# Patient Record
Sex: Female | Born: 1950 | Hispanic: No | Marital: Married
Health system: Southern US, Community
[De-identification: ages and names within clinical notes are randomized; demographics above are authoritative.]

## PROBLEM LIST (undated history)

## (undated) DIAGNOSIS — R519 Headache, unspecified: Secondary | ICD-10-CM

## (undated) DIAGNOSIS — I10 Essential (primary) hypertension: Secondary | ICD-10-CM

## (undated) DIAGNOSIS — R51 Headache: Secondary | ICD-10-CM

---

## 1998-11-23 ENCOUNTER — Encounter: Payer: Self-pay | Admitting: Neurosurgery

## 1998-11-23 ENCOUNTER — Emergency Department (HOSPITAL_COMMUNITY): Admission: EM | Admit: 1998-11-23 | Discharge: 1998-11-23 | Payer: Self-pay | Admitting: *Deleted

## 2001-06-04 ENCOUNTER — Inpatient Hospital Stay (HOSPITAL_COMMUNITY): Admission: AC | Admit: 2001-06-04 | Discharge: 2001-06-18 | Payer: Self-pay

## 2001-06-04 ENCOUNTER — Encounter: Payer: Self-pay | Admitting: Emergency Medicine

## 2001-06-07 ENCOUNTER — Encounter: Payer: Self-pay | Admitting: General Surgery

## 2001-06-20 ENCOUNTER — Ambulatory Visit (HOSPITAL_BASED_OUTPATIENT_CLINIC_OR_DEPARTMENT_OTHER): Admission: RE | Admit: 2001-06-20 | Discharge: 2001-06-20 | Payer: Self-pay | Admitting: *Deleted

## 2005-06-22 ENCOUNTER — Ambulatory Visit: Payer: Self-pay | Admitting: Unknown Physician Specialty

## 2005-12-13 ENCOUNTER — Emergency Department: Payer: Self-pay | Admitting: Emergency Medicine

## 2006-01-01 ENCOUNTER — Ambulatory Visit: Payer: Self-pay | Admitting: Family Medicine

## 2006-10-08 ENCOUNTER — Ambulatory Visit: Payer: Self-pay | Admitting: Family Medicine

## 2007-07-12 ENCOUNTER — Ambulatory Visit: Payer: Self-pay | Admitting: Family Medicine

## 2008-12-05 ENCOUNTER — Ambulatory Visit: Payer: Self-pay | Admitting: Family Medicine

## 2011-02-05 ENCOUNTER — Ambulatory Visit: Payer: Self-pay | Admitting: Family Medicine

## 2012-06-24 ENCOUNTER — Ambulatory Visit: Payer: Self-pay | Admitting: Family Medicine

## 2013-06-07 ENCOUNTER — Ambulatory Visit: Payer: Self-pay | Admitting: Family Medicine

## 2016-04-30 ENCOUNTER — Other Ambulatory Visit: Payer: Self-pay | Admitting: Family Medicine

## 2016-04-30 DIAGNOSIS — Z0000 Encounter for general adult medical examination without abnormal findings: Secondary | ICD-10-CM

## 2016-05-21 ENCOUNTER — Other Ambulatory Visit: Payer: Self-pay

## 2016-05-21 ENCOUNTER — Ambulatory Visit: Payer: Self-pay | Attending: Family Medicine

## 2016-07-24 ENCOUNTER — Encounter: Payer: Self-pay | Admitting: *Deleted

## 2016-07-27 ENCOUNTER — Ambulatory Visit: Payer: Medicaid Other | Admitting: Anesthesiology

## 2016-07-27 ENCOUNTER — Encounter
Admission: RE | Disposition: A | Discharge status: Home / Self Care | Payer: Self-pay | Source: Ambulatory Visit | Attending: Unknown Physician Specialty

## 2016-07-27 ENCOUNTER — Ambulatory Visit
Admission: RE | Admit: 2016-07-27 | Discharge: 2016-07-27 | Disposition: A | Discharge status: Home / Self Care | Payer: Medicaid Other | Source: Ambulatory Visit | Attending: Unknown Physician Specialty | Admitting: Unknown Physician Specialty

## 2016-07-27 DIAGNOSIS — D122 Benign neoplasm of ascending colon: Secondary | ICD-10-CM | POA: Insufficient documentation

## 2016-07-27 DIAGNOSIS — Z1211 Encounter for screening for malignant neoplasm of colon: Secondary | ICD-10-CM | POA: Insufficient documentation

## 2016-07-27 DIAGNOSIS — K648 Other hemorrhoids: Secondary | ICD-10-CM | POA: Insufficient documentation

## 2016-07-27 DIAGNOSIS — I10 Essential (primary) hypertension: Secondary | ICD-10-CM | POA: Insufficient documentation

## 2016-07-27 DIAGNOSIS — D120 Benign neoplasm of cecum: Secondary | ICD-10-CM | POA: Insufficient documentation

## 2016-07-27 HISTORY — DX: Essential (primary) hypertension: I10

## 2016-07-27 HISTORY — DX: Headache, unspecified: R51.9

## 2016-07-27 HISTORY — DX: Headache: R51

## 2016-07-27 HISTORY — PX: COLONOSCOPY WITH PROPOFOL: SHX5780

## 2016-07-27 SURGERY — COLONOSCOPY WITH PROPOFOL
Anesthesia: General

## 2016-07-27 MED ORDER — PROPOFOL 10 MG/ML IV BOLUS
INTRAVENOUS | Status: DC | PRN
  Administered 2016-07-27: 100 mg via INTRAVENOUS

## 2016-07-27 MED ORDER — SODIUM CHLORIDE 0.9 % IV SOLN
INTRAVENOUS | Status: DC
Start: 2016-07-27 — End: 2016-07-27
  Administered 2016-07-27: 1000 mL via INTRAVENOUS

## 2016-07-27 MED ORDER — PROPOFOL 500 MG/50ML IV EMUL
INTRAVENOUS | Status: DC | PRN
  Administered 2016-07-27: 120 ug/kg/min via INTRAVENOUS

## 2016-07-27 MED ORDER — MIDAZOLAM HCL 2 MG/2ML IJ SOLN
INTRAMUSCULAR | Status: DC | PRN
Start: 2016-07-27 — End: 2016-07-27
  Administered 2016-07-27: 1 mg via INTRAVENOUS

## 2016-07-27 NOTE — H&P (Signed)
   Primary Care Physician:  PROVIDER NOT [place] Primary Gastroenterologist:  Dr. Vira Agar  Pre-Procedure History & Physical: HPI:  Cynthia Fernandez is a 65 y.o. female is here for an colonoscopy.   Past Medical History:  Diagnosis Date  . Headache   . Hypertension     No past surgical history on file.  Prior to Admission medications   Medication Sig Start Date End Date Taking? Authorizing Provider  atorvastatin (LIPITOR) 40 MG tablet Take 40 mg by mouth daily.   Yes Historical Provider, MD  b complex vitamins capsule Take 1 capsule by mouth daily.   Yes Historical Provider, MD  gabapentin (NEURONTIN) 300 MG capsule Take 300 mg by mouth 3 (three) times daily.   Yes Historical Provider, MD  hydrochlorothiazide (HYDRODIURIL) 25 MG tablet Take 25 mg by mouth daily.   Yes Historical Provider, MD  meloxicam (MOBIC) 7.5 MG tablet Take 7.5 mg by mouth daily.   Yes Historical Provider, MD    Allergies as of 07/13/2016  . (Not on File)    No family history on file.  Social History   Social History  . Marital status: Married    Spouse name: N/A  . Number of children: N/A  . Years of education: N/A   Occupational History  . Not on file.   Social History Main Topics  . Smoking status: Not on file  . Smokeless tobacco: Not on file  . Alcohol use Not on file  . Drug use: Unknown  . Sexual activity: Not on file   Other Topics Concern  . Not on file   Social History Narrative  . No narrative on file    Review of Systems: See HPI, otherwise negative ROS  Physical Exam: BP (!) 155/67   Pulse 61   Temp 97.3 F (36.3 C) (Tympanic)   Resp 20   Ht 5' (1.524 m)   Wt 93 kg (205 lb)   SpO2 97%   BMI 40.04 kg/m  General:   Alert,  pleasant and cooperative in NAD Head:  Normocephalic and atraumatic. Neck:  Supple; no masses or thyromegaly. Lungs:  Clear throughout to auscultation.    Heart:  Regular rate and rhythm. Abdomen:  Soft, nontender and nondistended. Normal  bowel sounds, without guarding, and without rebound.   Neurologic:  Alert and  oriented x4;  grossly normal neurologically.  Impression/Plan: Cynthia Fernandez is here for an colonoscopy to be performed for screening  Risks, benefits, limitations, and alternatives regarding  colonoscopy have been reviewed with the patient.  Questions have been answered.  All parties agreeable.   Gaylyn Cheers, MD  07/27/2016, 9:02 AM

## 2016-07-27 NOTE — Anesthesia Preprocedure Evaluation (Addendum)
Anesthesia Evaluation  Patient identified by MRN, date of birth, ID band Patient awake    Reviewed: Allergy & Precautions, NPO status , Patient's Chart, lab work & pertinent test results  History of Anesthesia Complications Negative for: history of anesthetic complications  Airway Mallampati: III  TM Distance: >3 FB Neck ROM: Full    Dental  (+) Poor Dentition   Pulmonary neg pulmonary ROS, neg sleep apnea, neg COPD,    breath sounds clear to auscultation- rhonchi (-) wheezing      Cardiovascular Exercise Tolerance: Good hypertension, Pt. on medications (-) CAD, (-) Past MI and (-) Cardiac Stents  Rhythm:Regular Rate:Normal - Systolic murmurs and - Diastolic murmurs    Neuro/Psych  Headaches, negative psych ROS   GI/Hepatic negative GI ROS, Neg liver ROS,   Endo/Other  negative endocrine ROSneg diabetes  Renal/GU negative Renal ROS     Musculoskeletal negative musculoskeletal ROS (+)   Abdominal (+) + obese,   Peds  Hematology negative hematology ROS (+)   Anesthesia Other Findings Past Medical History: No date: Headache No date: Hypertension   Reproductive/Obstetrics                             Anesthesia Physical Anesthesia Plan  ASA: II  Anesthesia Plan: General   Post-op Pain Management:    Induction: Intravenous  Airway Management Planned: Natural Airway  Additional Equipment:   Intra-op Plan:   Post-operative Plan:   Informed Consent: I have reviewed the patients History and Physical, chart, labs and discussed the procedure including the risks, benefits and alternatives for the proposed anesthesia with the patient or authorized representative who has indicated his/her understanding and acceptance.   Dental advisory given  Plan Discussed with: CRNA and Anesthesiologist  Anesthesia Plan Comments:         Anesthesia Quick Evaluation

## 2016-07-27 NOTE — Anesthesia Postprocedure Evaluation (Signed)
Anesthesia Post Note  Patient: Cynthia Fernandez  Procedure(s) Performed: Procedure(s) (LRB): COLONOSCOPY WITH PROPOFOL (N/A)  Patient location during evaluation: Endoscopy Anesthesia Type: General Level of consciousness: awake and alert and oriented Pain management: pain level controlled Vital Signs Assessment: post-procedure vital signs reviewed and stable Respiratory status: spontaneous breathing, nonlabored ventilation and respiratory function stable Cardiovascular status: blood pressure returned to baseline and stable Postop Assessment: no signs of nausea or vomiting Anesthetic complications: no    Last Vitals:  Vitals:   07/27/16 0928 07/27/16 0930  BP: (!) 108/58 117/61  Pulse: 65   Resp: 16   Temp: (!) 35.8 C     Last Pain:  Vitals:   07/27/16 0928  TempSrc: Tympanic                 Oletta Buehring

## 2016-07-27 NOTE — Op Note (Signed)
[facility] Patient Name: Cynthia Fernandez Procedure Date: 07/27/2016 8:40 AM MRN: [id number] Account #: [id number] Date of Birth: [birth year] Admit Type: Outpatient Age: 65 Room: [facility] ENDO ROOM 1 Gender: Female Note Status: Finalized Procedure:            Colonoscopy Indications:          Screening for colorectal malignant neoplasm Providers:            Manya Silvas, MD Referring MD:         Denton Lank MD, MD (Referring MD) Medicines:            Propofol per Anesthesia Complications:        No immediate complications. Procedure:            Pre-Anesthesia Assessment:                       - After reviewing the risks and benefits, the patient                        was deemed in satisfactory condition to undergo the                        procedure.                       After obtaining informed consent, the colonoscope was                        passed under direct vision. Throughout the procedure,                        the patient's blood pressure, pulse, and oxygen                        saturations were monitored continuously. The                        Colonoscope was introduced through the anus and                        advanced to the the cecum, identified by appendiceal                        orifice and ileocecal valve. The colonoscopy was                        performed without difficulty. The patient tolerated the                        procedure well. The quality of the bowel preparation                        was excellent. Findings:      Two sessile polyps were found in the ascending colon, proximal ascending       colon and cecum. The polyps were diminutive in size. These polyps were       removed with a jumbo cold forceps. Resection and retrieval were complete.      Internal hemorrhoids were found during endoscopy. The hemorrhoids were       small and Grade I (internal hemorrhoids that do not prolapse).  The exam was  otherwise without abnormality. Impression:           - Two diminutive polyps in the ascending colon, in the                        proximal ascending colon and in the cecum, removed with                        a jumbo cold forceps. Resected and retrieved.                       - Internal hemorrhoids.                       - The examination was otherwise normal. Recommendation:       - Await pathology results. Manya Silvas, MD 07/27/2016 9:27:11 AM This report has been signed electronically. Number of Addenda: 0 Note Initiated On: 07/27/2016 8:40 AM Scope Withdrawal Time: 0 hours 8 minutes 38 seconds  Total Procedure Duration: 0 hours 15 minutes 11 seconds       [facility]

## 2016-07-27 NOTE — Transfer of Care (Signed)
Immediate Anesthesia Transfer of Care Note  Patient: Cynthia Fernandez  Procedure(s) Performed: Procedure(s): COLONOSCOPY WITH PROPOFOL (N/A)  Patient Location: PACU and Endoscopy Unit  Anesthesia Type:General  Level of Consciousness: patient cooperative and lethargic  Airway & Oxygen Therapy: Patient Spontanous Breathing and Patient connected to nasal cannula oxygen  Post-op Assessment: Report given to RN and Post -op Vital signs reviewed and stable  Post vital signs: Reviewed and stable  Last Vitals:  Vitals:   07/27/16 0840 07/27/16 0928  BP: (!) 155/67 (!) 108/58  Pulse: 61 65  Resp: 20 16  Temp: 36.3 C (!) 35.8 C    Last Pain:  Vitals:   07/27/16 0928  TempSrc: Tympanic         Complications: No apparent anesthesia complications

## 2016-07-27 NOTE — OR Nursing (Signed)
Jason Coop, [facility] Interpreter used in Pre-op.

## 2016-07-28 ENCOUNTER — Encounter: Payer: Self-pay | Admitting: Unknown Physician Specialty

## 2016-07-28 LAB — SURGICAL PATHOLOGY

## 2017-02-18 ENCOUNTER — Other Ambulatory Visit: Payer: Self-pay | Admitting: Family Medicine

## 2017-02-18 DIAGNOSIS — Z0000 Encounter for general adult medical examination without abnormal findings: Secondary | ICD-10-CM

## 2017-02-18 DIAGNOSIS — Z1231 Encounter for screening mammogram for malignant neoplasm of breast: Secondary | ICD-10-CM

## 2017-04-26 ENCOUNTER — Other Ambulatory Visit: Payer: Medicaid Other

## 2017-05-21 ENCOUNTER — Other Ambulatory Visit: Payer: Medicaid Other

## 2018-06-03 ENCOUNTER — Other Ambulatory Visit: Payer: Self-pay | Admitting: Family Medicine

## 2018-06-03 DIAGNOSIS — Z13820 Encounter for screening for osteoporosis: Secondary | ICD-10-CM

## 2018-06-03 DIAGNOSIS — Z1231 Encounter for screening mammogram for malignant neoplasm of breast: Secondary | ICD-10-CM

## 2018-06-30 ENCOUNTER — Other Ambulatory Visit: Payer: Self-pay

## 2018-10-06 ENCOUNTER — Other Ambulatory Visit: Payer: Self-pay | Admitting: Family Medicine

## 2018-10-06 ENCOUNTER — Ambulatory Visit
Admission: RE | Admit: 2018-10-06 | Discharge: 2018-10-06 | Disposition: A | Discharge status: Home / Self Care | Payer: Medicare Other | Attending: Family Medicine | Admitting: Family Medicine

## 2018-10-06 ENCOUNTER — Ambulatory Visit
Admission: RE | Admit: 2018-10-06 | Discharge: 2018-10-06 | Disposition: A | Discharge status: Home / Self Care | Payer: Medicare Other | Source: Ambulatory Visit | Attending: Family Medicine | Admitting: Family Medicine

## 2018-10-06 DIAGNOSIS — R918 Other nonspecific abnormal finding of lung field: Secondary | ICD-10-CM | POA: Insufficient documentation

## 2018-10-06 DIAGNOSIS — R410 Disorientation, unspecified: Secondary | ICD-10-CM

## 2018-10-28 ENCOUNTER — Other Ambulatory Visit: Payer: Self-pay | Admitting: Family Medicine

## 2018-10-28 DIAGNOSIS — Z1231 Encounter for screening mammogram for malignant neoplasm of breast: Secondary | ICD-10-CM

## 2018-11-17 ENCOUNTER — Ambulatory Visit
Admission: RE | Admit: 2018-11-17 | Discharge: 2018-11-17 | Disposition: A | Discharge status: Home / Self Care | Payer: Medicare Other | Source: Ambulatory Visit | Attending: Family Medicine | Admitting: Family Medicine

## 2018-11-17 DIAGNOSIS — Z1231 Encounter for screening mammogram for malignant neoplasm of breast: Secondary | ICD-10-CM

## 2019-09-20 ENCOUNTER — Other Ambulatory Visit: Payer: Self-pay | Admitting: Family Medicine

## 2019-09-20 DIAGNOSIS — R221 Localized swelling, mass and lump, neck: Secondary | ICD-10-CM

## 2019-09-22 ENCOUNTER — Other Ambulatory Visit: Payer: Self-pay | Admitting: Family Medicine

## 2019-09-22 DIAGNOSIS — R221 Localized swelling, mass and lump, neck: Secondary | ICD-10-CM

## 2019-09-22 DIAGNOSIS — R1310 Dysphagia, unspecified: Secondary | ICD-10-CM

## 2019-09-25 ENCOUNTER — Other Ambulatory Visit: Payer: Self-pay

## 2019-09-25 ENCOUNTER — Ambulatory Visit
Admission: RE | Admit: 2019-09-25 | Discharge: 2019-09-25 | Disposition: A | Discharge status: Home / Self Care | Payer: Medicare Other | Source: Ambulatory Visit | Attending: Family Medicine | Admitting: Family Medicine

## 2019-09-25 DIAGNOSIS — R221 Localized swelling, mass and lump, neck: Secondary | ICD-10-CM | POA: Diagnosis present

## 2019-09-25 DIAGNOSIS — R1310 Dysphagia, unspecified: Secondary | ICD-10-CM

## 2021-10-14 IMAGING — US US THYROID
1 series · 13 of 25 positions shown · non-contrast
Comparison: None.

CLINICAL DATA: Neck swelling, dysphagia

EXAM:
THYROID ULTRASOUND
TECHNIQUE: Ultrasound examination of the thyroid gland and adjacent soft
tissues was performed.

[Series 1: us thyroid · 13 of 60 slices shown]
[im 1/60]
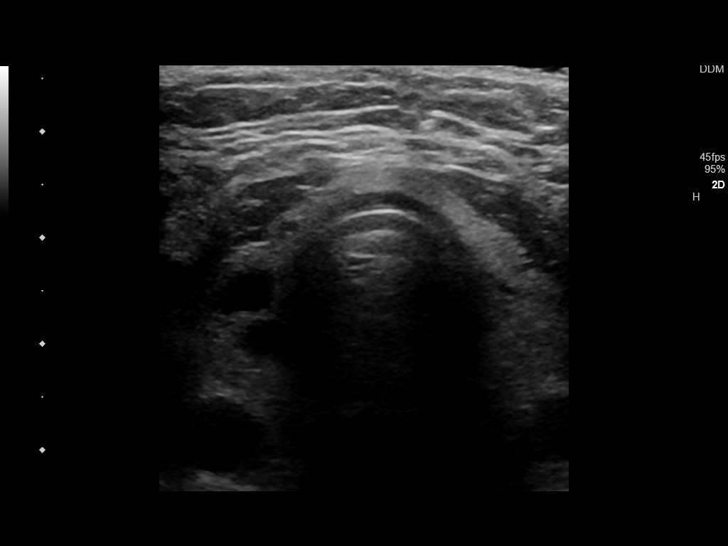
[im 5/60]
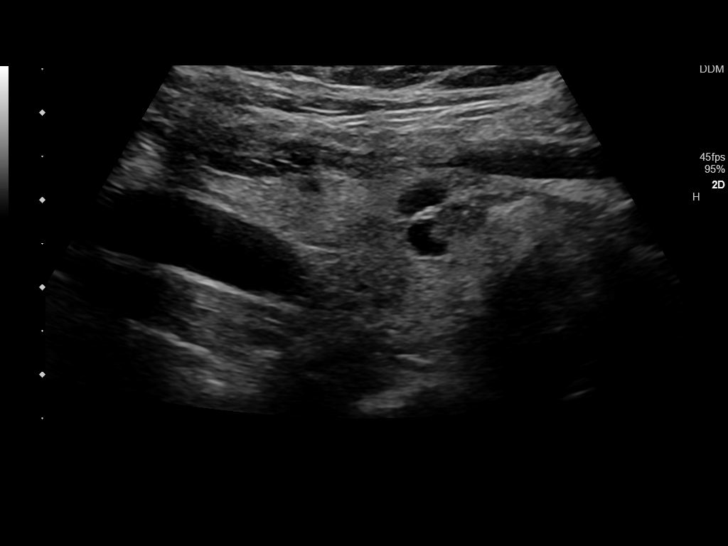
[im 10/60]
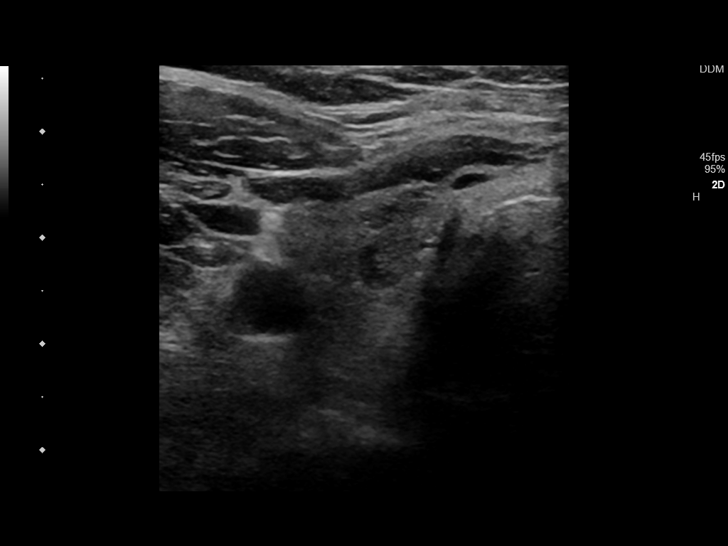
[im 15/60]
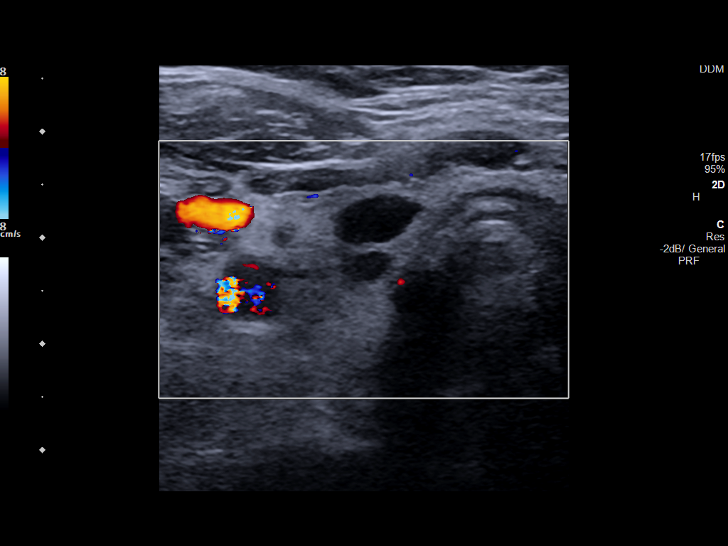
[im 20/60]
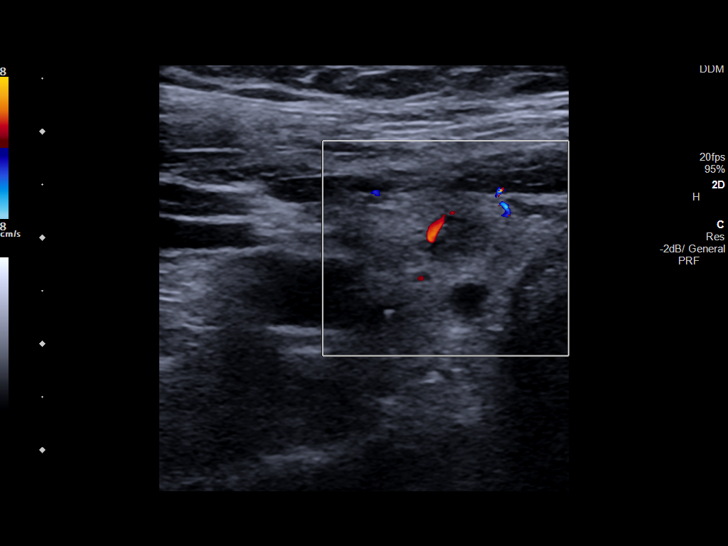
[im 25/60]
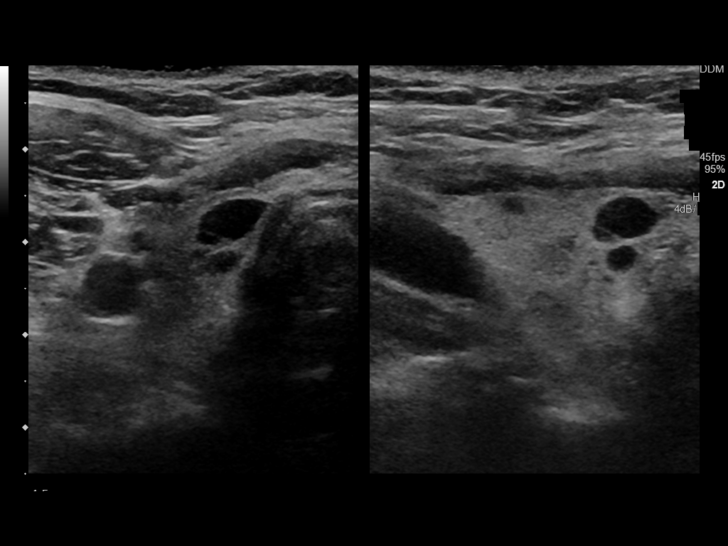
[im 30/60]
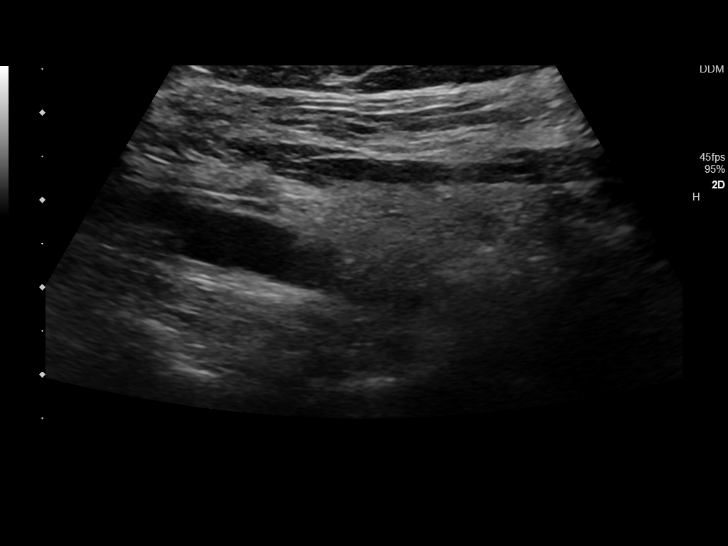
[im 35/60]
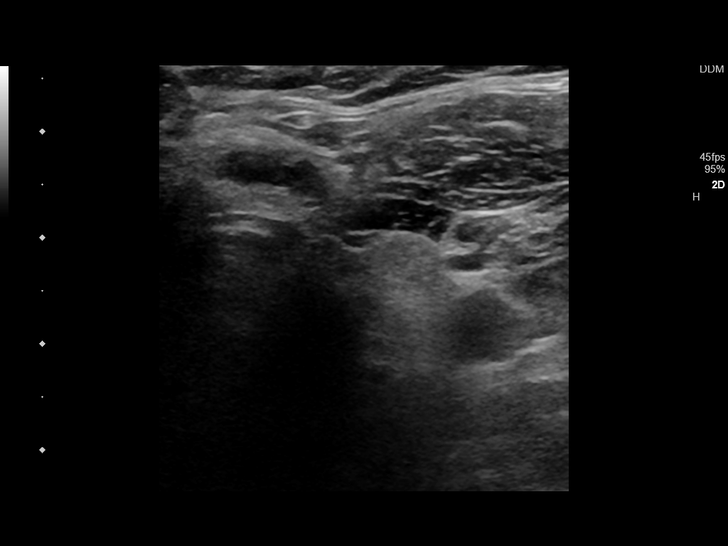
[im 40/60]
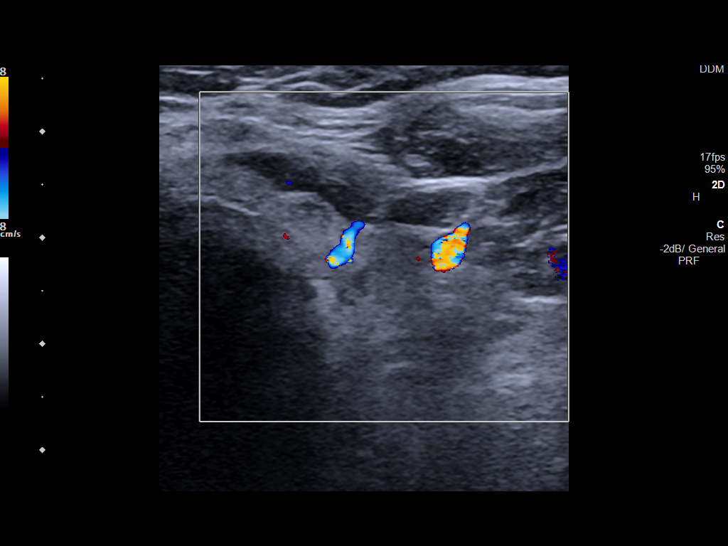
[im 45/60]
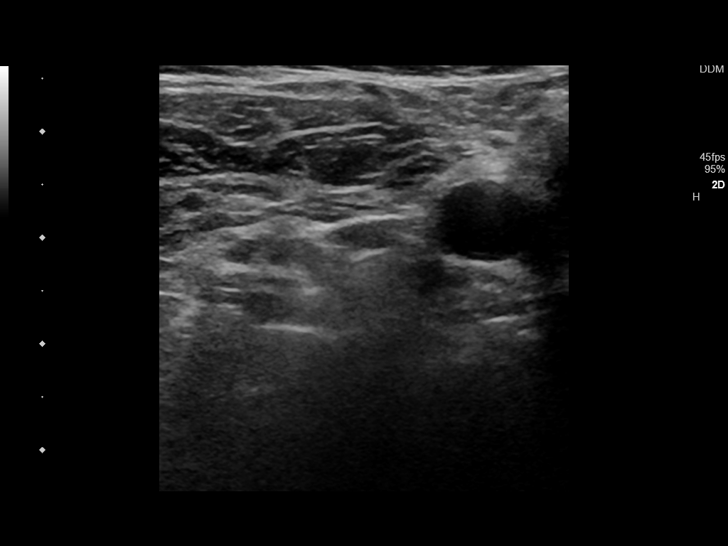
[im 50/60]
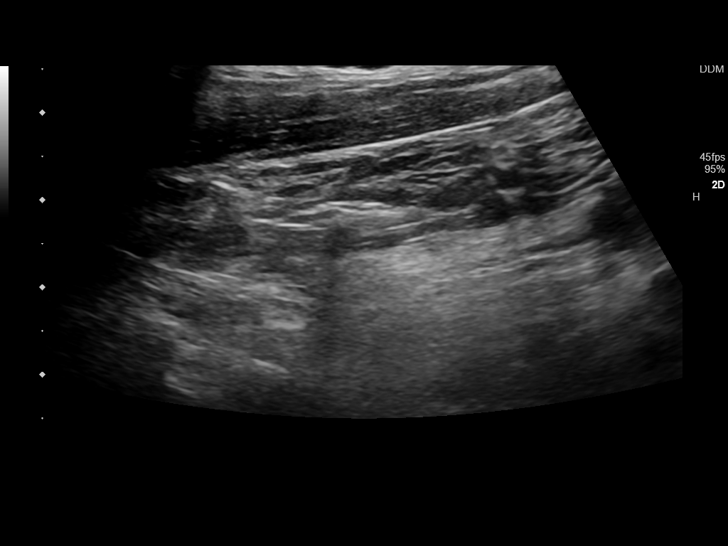
[im 55/60]
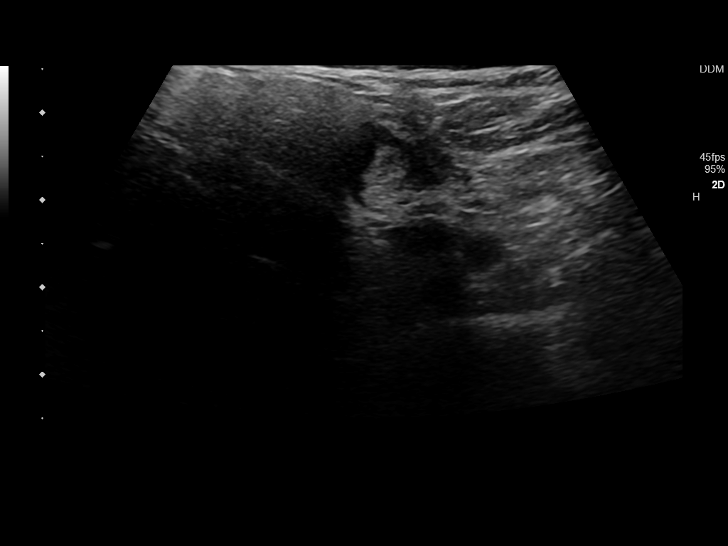
[im 60/60]
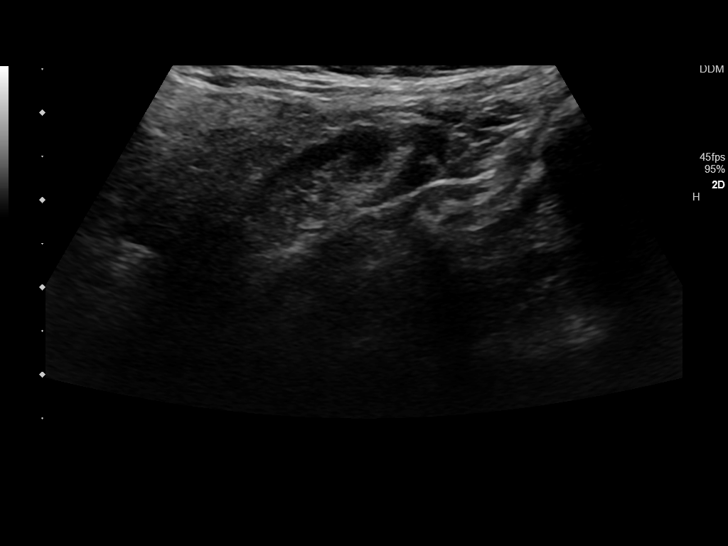

[13 of 25 positions shown; findings below may reference images not displayed]

FINDINGS: Parenchymal Echotexture: Moderately heterogenous

Isthmus: 0.3 cm thickness

Right lobe: 4.3 x 2.1 x 1.5 cm

Left lobe: 3.9 x 1.2 x 1.2 cm

_________________________________________________________

Estimated total number of nodules >/= 1 cm: 0

Number of spongiform nodules >/=  2 cm not described below (TR1): 0

Number of mixed cystic and solid nodules >/= 1.5 cm not described
below (TR2): 0

_________________________________________________________

Nodule # 1:

Location: Right; Inferior

Maximum size: 0.9 cm; Other 2 dimensions: 0.8 x 0.6 cm

Composition: solid/almost completely solid (2)

Echogenicity: hypoechoic (2)

Shape: not taller-than-wide (0)

Margins: ill-defined (0)

Echogenic foci: punctate echogenic foci (3)

ACR TI-RADS total points: 7.

ACR TI-RADS risk category: TR5 (>/= 7 points).

ACR TI-RADS recommendations:

*Given size (>/= 0.5 - 0.9 cm) and appearance, a follow-up
ultrasound in 1 year should be considered based on TI-RADS criteria.

_________________________________________________________

0.8 cm benign colloid cyst, mid right lobe. Additional scattered
cysts and hypoechoic lesions less than 0.5 cm bilaterally.

There is a morphologically unremarkable left submandibular cervical
lymph node 0.9 cm short axis diameter which corresponds to palpable
region.
IMPRESSION: 1. Normal-sized thyroid with small nodules and cysts. None meet
criteria for biopsy.
2. Recommend annual/biennial ultrasound follow-up of inferior right
nodule as above, until stability x5 years confirmed.
3. Morphologically unremarkable left submandibular lymph node
corresponds to palpable region.

The above is in keeping with the ACR TI-RADS recommendations - [HOSPITAL] 8901;[DATE].

## 2022-02-18 ENCOUNTER — Encounter: Payer: Self-pay | Admitting: Neurology

## 2022-02-18 ENCOUNTER — Other Ambulatory Visit: Payer: Self-pay

## 2022-02-18 DIAGNOSIS — R202 Paresthesia of skin: Secondary | ICD-10-CM

## 2022-05-19 ENCOUNTER — Encounter: Payer: Medicare Other | Admitting: Neurology

## 2022-05-19 ENCOUNTER — Encounter: Payer: Self-pay | Admitting: Neurology

## 2022-05-19 DIAGNOSIS — Z029 Encounter for administrative examinations, unspecified: Secondary | ICD-10-CM
